# Patient Record
Sex: Female | Born: 2000 | Race: White | Hispanic: No | Marital: Single | Smoking: Never smoker
Health system: Southern US, Community
[De-identification: ages and names within clinical notes are randomized; demographics above are authoritative.]

---
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
## 2019-01-12 ENCOUNTER — Other Ambulatory Visit: Payer: Self-pay

## 2019-01-12 ENCOUNTER — Emergency Department: Payer: 59

## 2019-01-12 ENCOUNTER — Emergency Department
Admission: EM | Admit: 2019-01-12 | Discharge: 2019-01-12 | Disposition: A | Discharge status: Home / Self Care | Payer: 59 | Attending: Emergency Medicine | Admitting: Emergency Medicine

## 2019-01-12 DIAGNOSIS — M7918 Myalgia, other site: Secondary | ICD-10-CM | POA: Diagnosis present

## 2019-01-12 DIAGNOSIS — M25561 Pain in right knee: Secondary | ICD-10-CM | POA: Insufficient documentation

## 2019-01-12 DIAGNOSIS — R51 Headache: Secondary | ICD-10-CM | POA: Diagnosis not present

## 2019-01-12 DIAGNOSIS — M25562 Pain in left knee: Secondary | ICD-10-CM | POA: Diagnosis not present

## 2019-01-12 MED ORDER — LIDOCAINE 5 % EX PTCH
1.0000 | MEDICATED_PATCH | CUTANEOUS | Status: DC
  Administered 2019-01-12: 1 via TRANSDERMAL

## 2019-01-12 MED ORDER — OXYCODONE-ACETAMINOPHEN 5-325 MG PO TABS
1.0000 | ORAL_TABLET | Freq: Once | ORAL | Status: AC
  Administered 2019-01-12: 1 via ORAL
  Filled 2019-01-12: qty 1

## 2019-01-12 MED ORDER — ONDANSETRON 4 MG PO TBDP
4.0000 mg | ORAL_TABLET | Freq: Once | ORAL | Status: AC
  Administered 2019-01-12: 4 mg via ORAL

## 2019-01-12 MED ORDER — ONDANSETRON 4 MG PO TBDP
ORAL_TABLET | ORAL | Status: AC
  Administered 2019-01-12: 4 mg via ORAL
  Filled 2019-01-12: qty 1

## 2019-01-12 MED ORDER — CYCLOBENZAPRINE HCL 10 MG PO TABS: 10.0000 mg | ORAL_TABLET | Freq: Three times a day (TID) | ORAL | 0 refills | Status: AC | PRN

## 2019-01-12 NOTE — ED Notes (Signed)
Patient transported to CT 

## 2019-01-12 NOTE — ED Notes (Signed)
ED Provider at bedside. 
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
## 2019-07-23 ENCOUNTER — Other Ambulatory Visit: Payer: Self-pay

## 2019-07-23 DIAGNOSIS — Z20822 Contact with and (suspected) exposure to covid-19: Secondary | ICD-10-CM

## 2019-07-24 LAB — NOVEL CORONAVIRUS, NAA: SARS-CoV-2, NAA: NOT DETECTED
